# Patient Record
Sex: Female | Born: 1935 | Race: White | Hispanic: No | Marital: Married
Health system: Midwestern US, Academic
[De-identification: ages and names within clinical notes are randomized; demographics above are authoritative.]

---

## 2018-04-03 ENCOUNTER — Encounter: Admit: 2018-04-03 | Discharge: 2018-04-03 | Payer: MEDICARE

## 2018-04-20 ENCOUNTER — Ambulatory Visit: Admit: 2018-04-20 | Discharge: 2018-04-20 | Payer: MEDICARE

## 2018-04-20 DIAGNOSIS — H6123 Impacted cerumen, bilateral: Principal | ICD-10-CM

## 2018-04-20 DIAGNOSIS — H903 Sensorineural hearing loss, bilateral: ICD-10-CM

## 2018-05-07 ENCOUNTER — Ambulatory Visit: Admit: 2018-05-07 | Discharge: 2018-05-08 | Payer: MEDICARE

## 2018-05-08 DIAGNOSIS — H903 Sensorineural hearing loss, bilateral: Principal | ICD-10-CM

## 2021-03-30 ENCOUNTER — Encounter: Admit: 2021-03-30 | Discharge: 2021-03-30 | Payer: MEDICARE

## 2021-03-30 DIAGNOSIS — I4891 Unspecified atrial fibrillation: Secondary | ICD-10-CM

## 2021-03-30 DIAGNOSIS — M1990 Unspecified osteoarthritis, unspecified site: Secondary | ICD-10-CM

## 2021-03-30 DIAGNOSIS — N200 Calculus of kidney: Secondary | ICD-10-CM

## 2021-03-30 DIAGNOSIS — H9190 Unspecified hearing loss, unspecified ear: Secondary | ICD-10-CM

## 2021-03-30 DIAGNOSIS — I10 Essential (primary) hypertension: Secondary | ICD-10-CM

## 2021-03-30 DIAGNOSIS — J9690 Respiratory failure, unspecified, unspecified whether with hypoxia or hypercapnia: Secondary | ICD-10-CM

## 2021-03-30 DIAGNOSIS — E039 Hypothyroidism, unspecified: Secondary | ICD-10-CM

## 2021-03-30 DIAGNOSIS — F419 Anxiety disorder, unspecified: Secondary | ICD-10-CM

## 2021-11-10 ENCOUNTER — Encounter: Admit: 2021-11-10 | Discharge: 2021-11-10 | Payer: MEDICARE

## 2021-11-10 NOTE — Progress Notes
86yo female with h/o morbid obesity, HOH has bilat hearing aids, TIA, OSA, arthritis, HTN, hypothyroid,  nephrolithiasis, paroxysmal Afib, remote GIB while on Pradaxa. Is a permanent resident at a SNF came into the ED last night for small vaginal bleeding pt presumed it was from a small pressure ulcer on her buttock. D/t her arthritis the cervix was difficult to visualize. CT A/P obtained to r/o a uterine mass. There was an incidental finding of a hypodense lesion in the spleen suspicious for an abscess. CBC, BMP are normal, UA (+) for a UTI. Received 1g Rocephin and started on PO Omnicef. Was released back to the SNF at ~0400. Was planning on re-imaging on Monday.

## 2022-01-16 DEATH — deceased

## 2022-08-02 IMAGING — CT BRAIN WO(Adult)
2 of 4 series · 12 of 47 positions shown, 15 images · non-contrast
Comparison: none

PROCEDURE: BRAIN WO(Adult)
HISTORY: ALTERED MENTAL STATUS, CONFUSION, HALLUCINATIONS, DECREASED O2 SATS REQUIRING 4L O2.
TECHNIQUE: Axial CT imaging of the brain was performed without contrast. No comparison study
available. This exam was performed using one or more the following dose reduction techniques:
Automated exposure control, adjustment of the mA and/or KV according to the patient's size or use of
iterative reconstruction technique. Total DLP dose measures 746 mGy with a total CTDI dose measuring

[Series 4: brain cor 5.00 hr40 s3 · coronal · 0.31mm/px · 3 of 42 slices shown]
[im 14/42  brain]
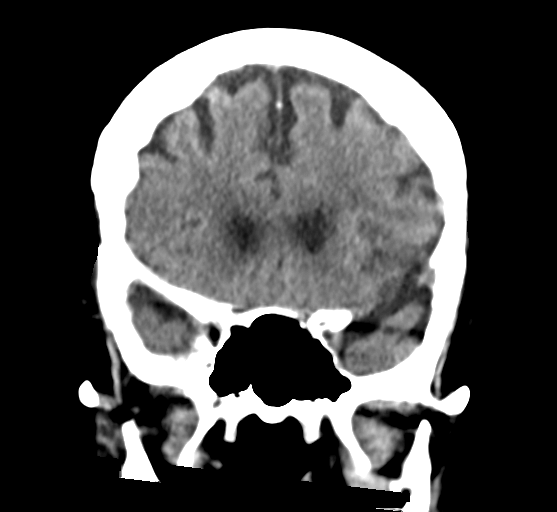
[im 19/42  brain]
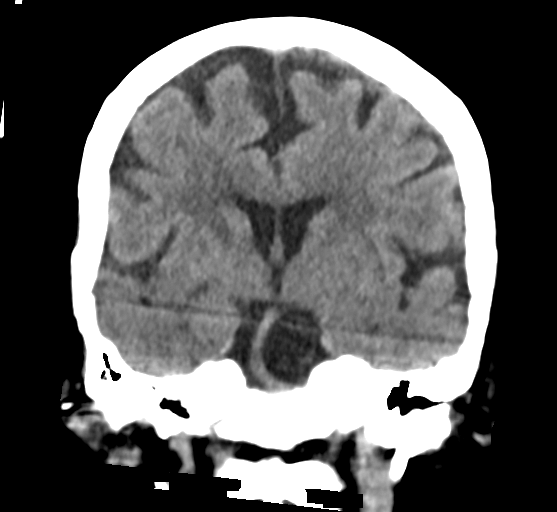
[im 23/42  brain]
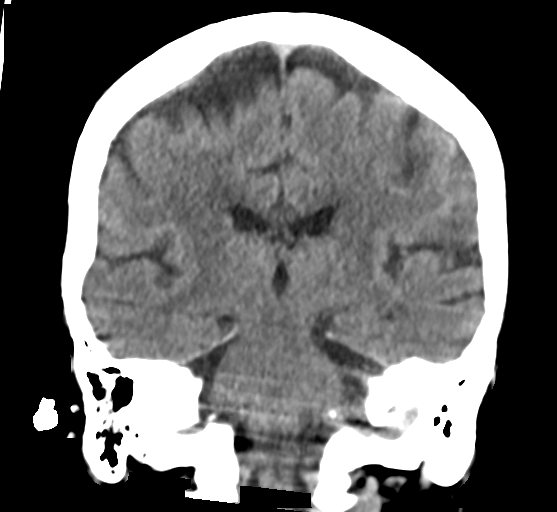

[Series 8: brain ax 2.00 hr60 s3 · axial · 0.34mm/px · z∈[-568,-445]mm · 9 of 71 slices shown, 12 images]
[im 8/71  brain]
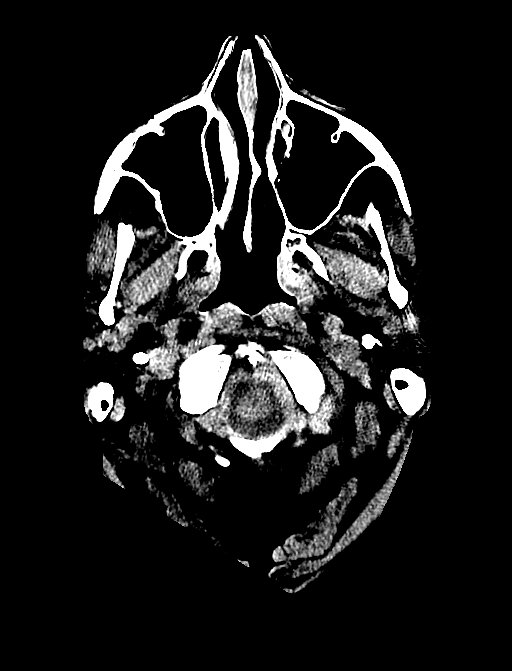
[im 8/71  bone]
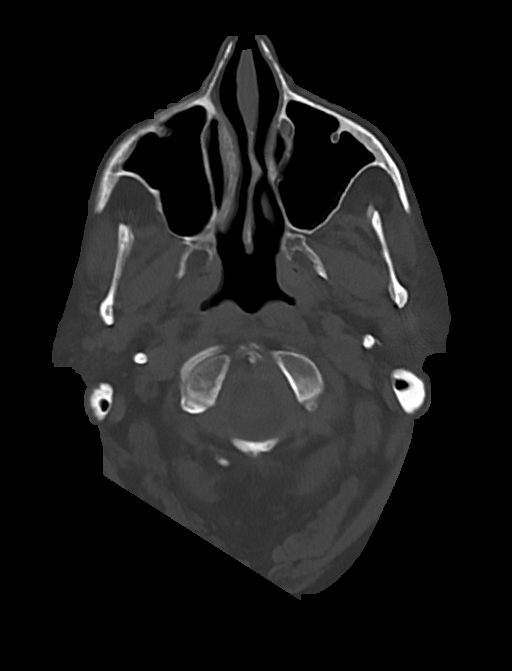
[im 15/71  brain]
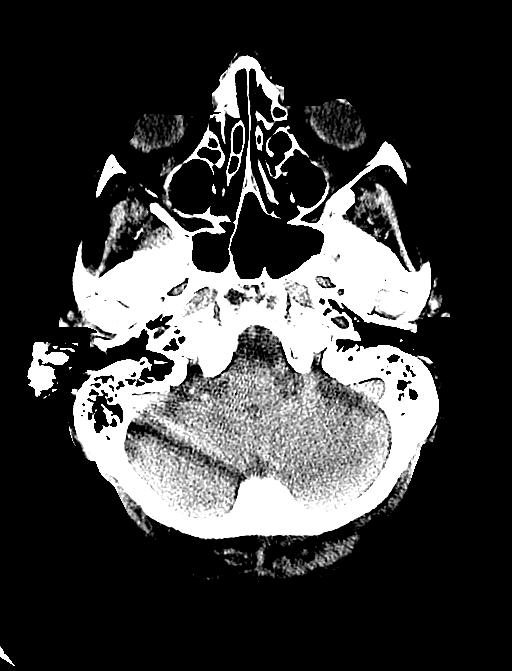
[im 22/71  brain]
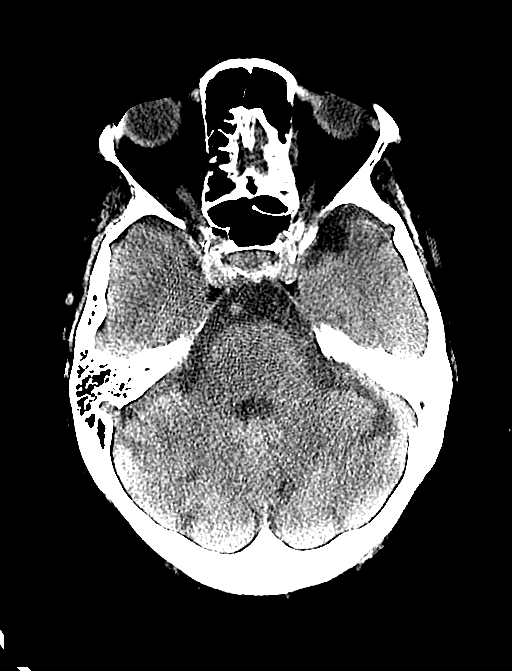
[im 29/71  brain]
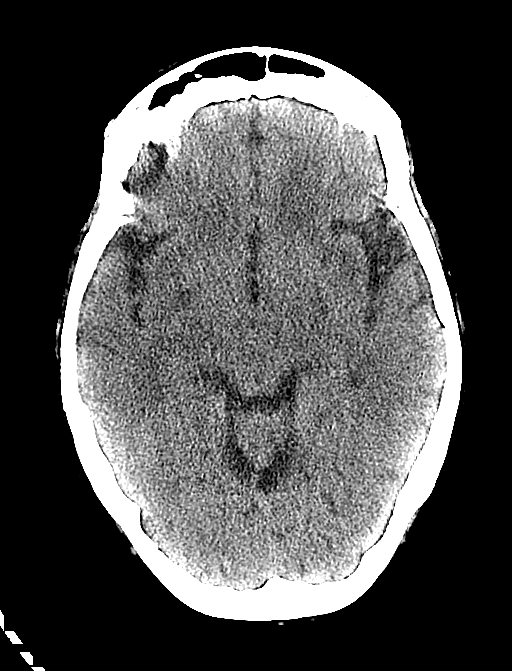
[im 36/71  brain]
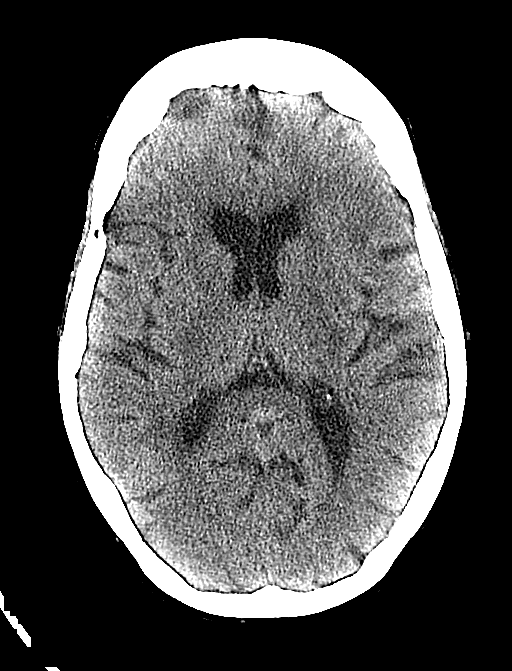
[im 36/71  bone]
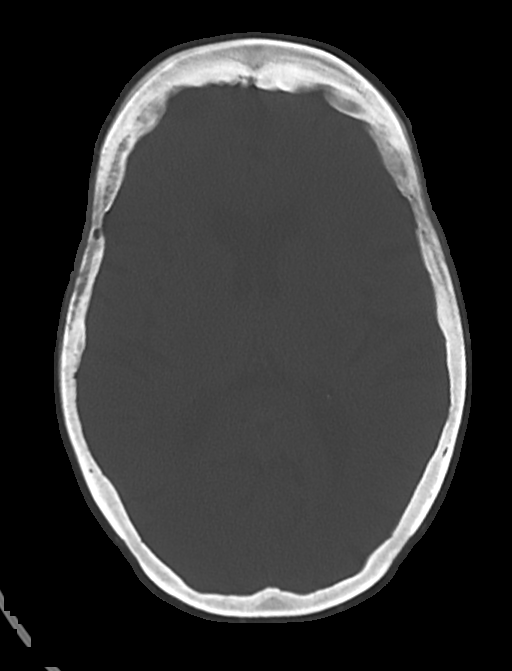
[im 43/71  brain]
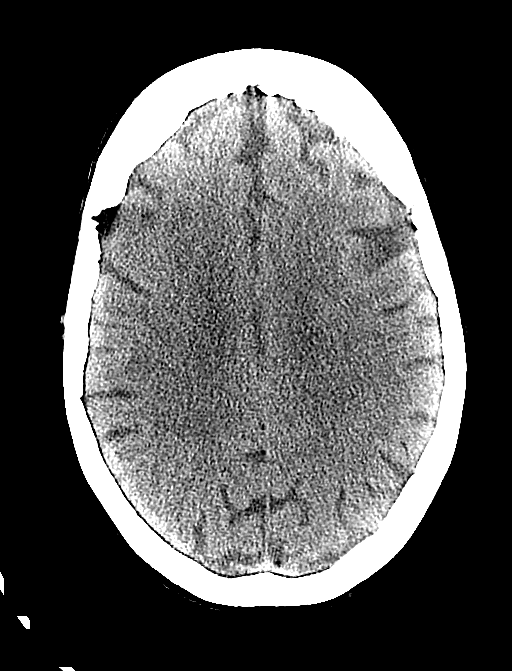
[im 50/71  brain]
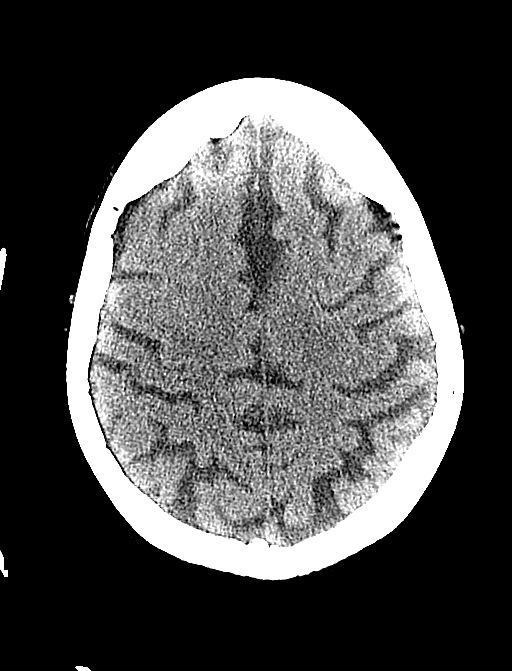
[im 57/71  brain]
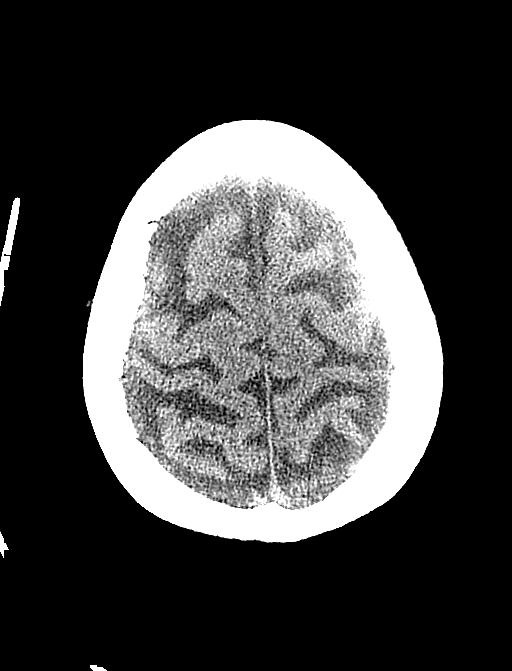
[im 64/71  brain]
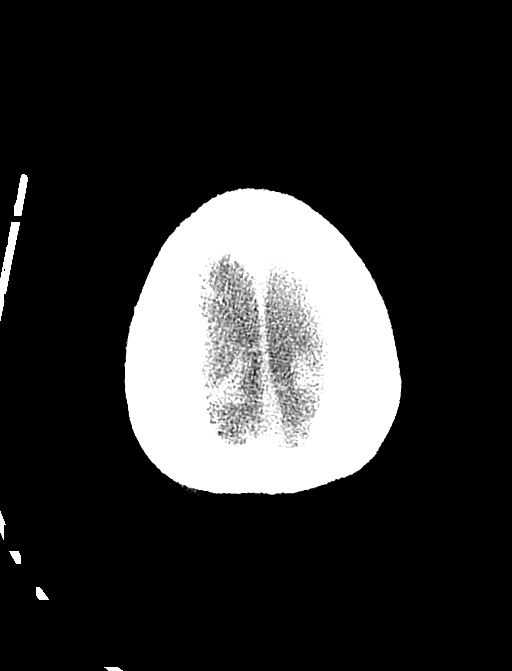
[im 64/71  bone]
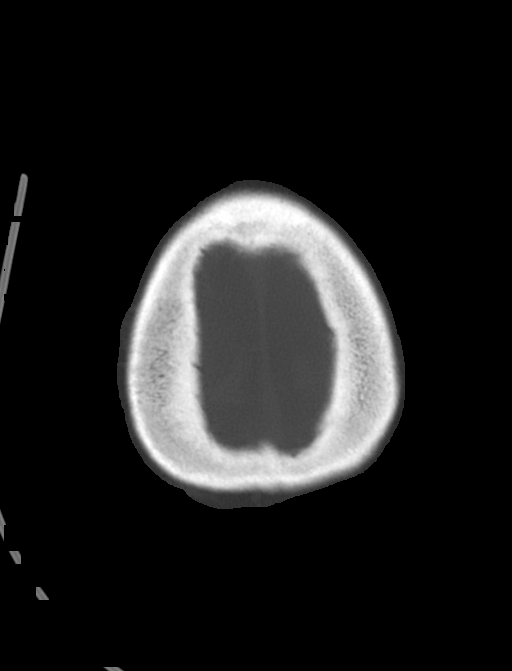

[12 of 47 positions shown; findings below may reference images not displayed]

FINDINGS: There is good gray white differentiation and sulcal detail. I don't see any acute large
vessel distribution infarction, intracranial bleed, or focal mass. Moderate periventricular and
subcortical hypodensities are seen consistent with small vessel ischemic disease. The ventricles are
enlarged in size, though midline in position. The ventricular size correlates with degree of
cortical volume loss. There is cortical volume loss of both the supra and infratentorium. I don't
see any cerebellopontine angle mass. No displaced skull fracture seen. The paranasal sinuses are
clear.
IMPRESSION: 1. No acute large vessel distribution infarction, intracranial bleed, or focal mass seen.
2. Moderate chronic age related periventricular and subcortical hypodensities seen which are
consistent with small vessel ischemic disease or the effects of vasculopathy.

Tech Notes:

ALTERED MENTAL STATUS. SOA.

## 2022-08-03 IMAGING — CR [ID]
1 series · 1 of 1 positions shown · non-contrast
Comparison: none

PROCEDURE: [id number]
HISTORY: ALTERED MENTAL STATUS, CONFUSION, HALLUCINATIONS, DECREASED O2 SATS REQUIRING 4L O2.

[x chest ap]
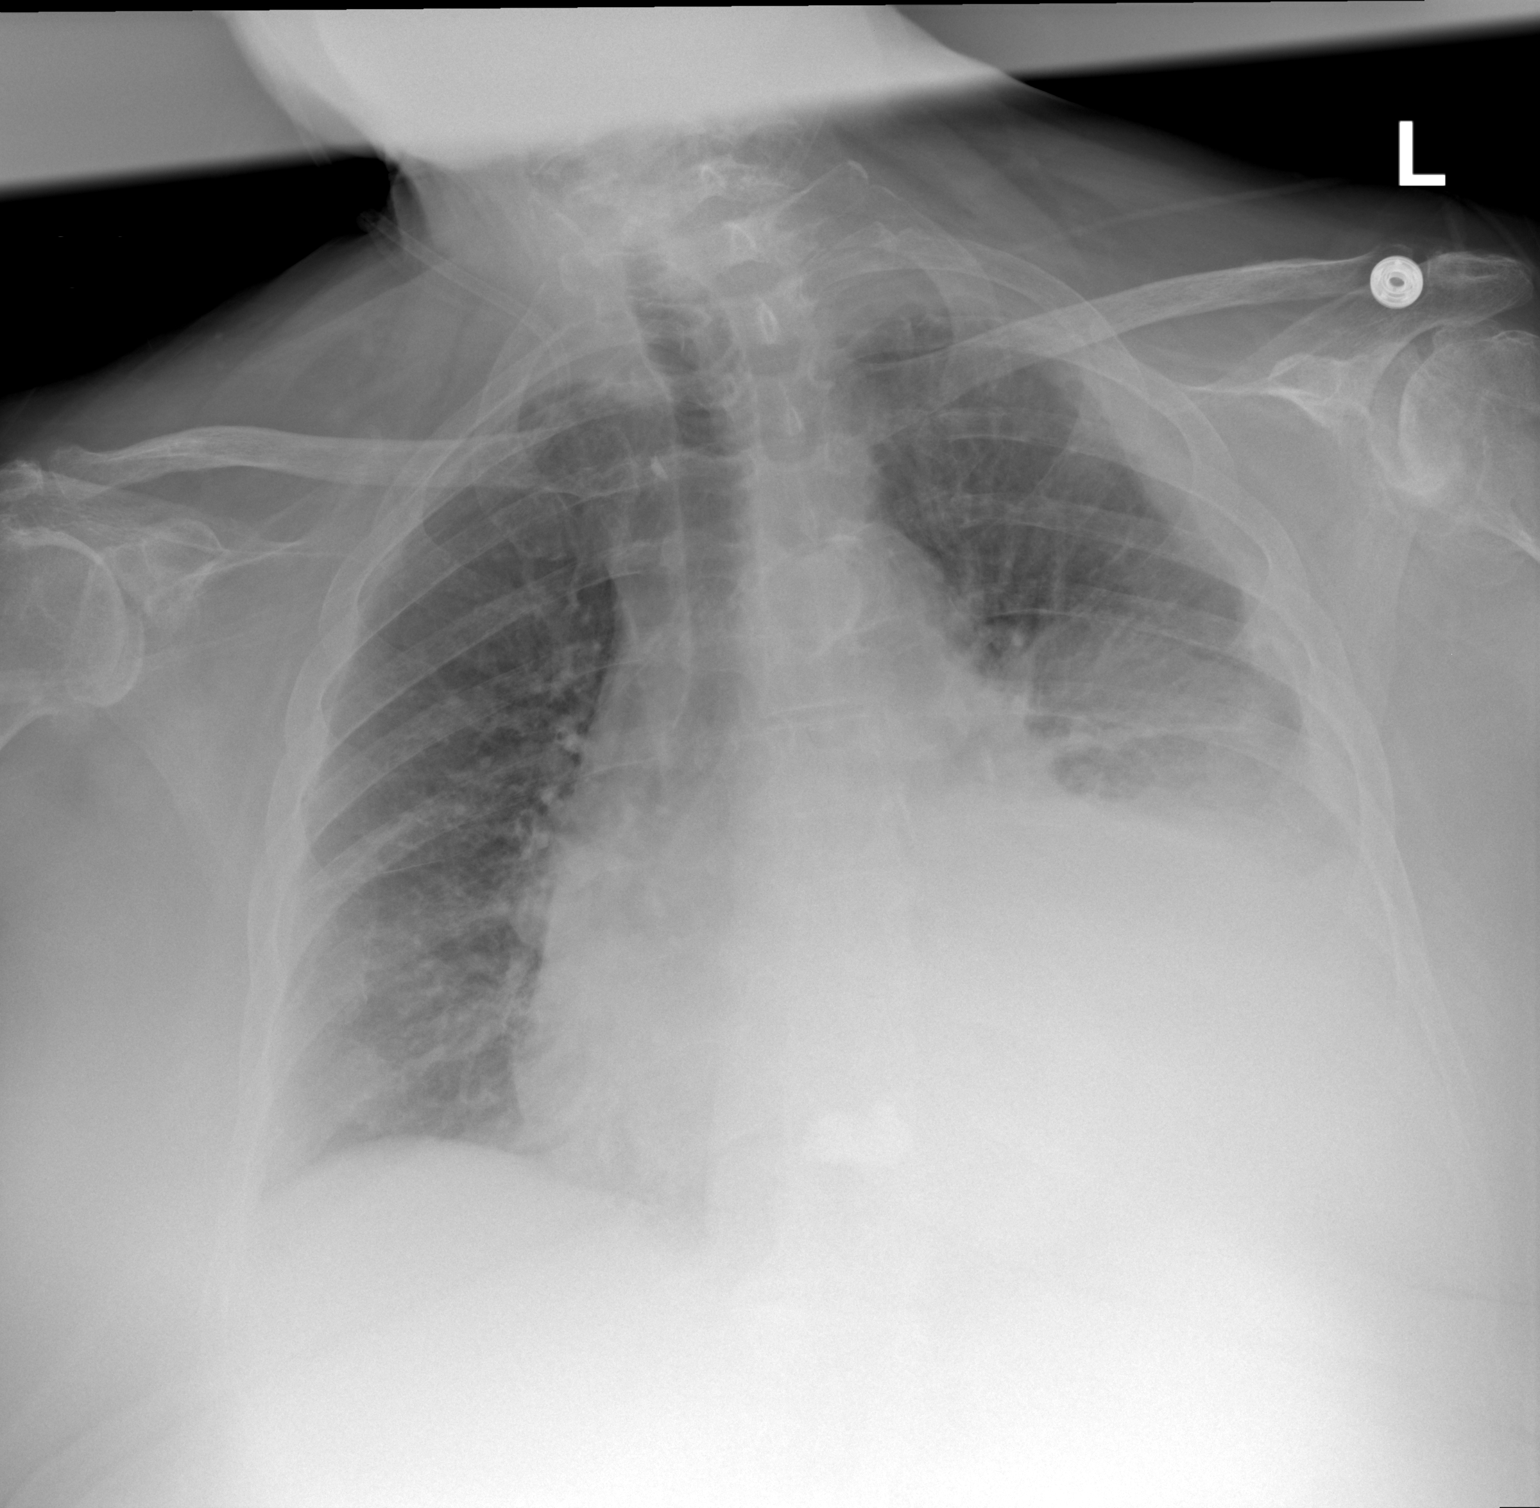

[1 of 1 positions shown; findings below may reference images not displayed]

FINDINGS: Single view chest without comparison shows the cardiac silhouette is moderately enlarged
in size. There is pulmonary vascular congestion seen. There is also superimposed focal airspace
opacification seen in the left perihilar lung. There is a small left pleural effusion seen.
Visualized osseous structures show no acute findings.
IMPRESSION: 1. Moderate cardiomegaly with congestive failure.
2. Superimposed left perihilar infiltrate is seen, follow up recommended.

Tech Notes:

ALTERED MENTAL STATUS. SOA.
